# Patient Record
Sex: Male | Born: 1980 | Race: White | Hispanic: No | Marital: Single | Smoking: Current every day smoker
Health system: Southern US, Community
[De-identification: ages and names within clinical notes are randomized; demographics above are authoritative.]

## PROBLEM LIST (undated history)

## (undated) DIAGNOSIS — I10 Essential (primary) hypertension: Secondary | ICD-10-CM

---
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
## 2005-02-10 ENCOUNTER — Emergency Department (HOSPITAL_COMMUNITY): Admission: EM | Admit: 2005-02-10 | Discharge: 2005-02-10 | Payer: Self-pay | Admitting: Emergency Medicine

## 2005-07-17 ENCOUNTER — Emergency Department (HOSPITAL_COMMUNITY): Admission: EM | Admit: 2005-07-17 | Discharge: 2005-07-17 | Payer: Self-pay | Admitting: Emergency Medicine

## 2005-08-21 ENCOUNTER — Ambulatory Visit (HOSPITAL_COMMUNITY): Admission: RE | Admit: 2005-08-21 | Discharge: 2005-08-21 | Payer: Self-pay | Admitting: Emergency Medicine

## 2005-08-21 ENCOUNTER — Emergency Department (HOSPITAL_COMMUNITY): Admission: EM | Admit: 2005-08-21 | Discharge: 2005-08-21 | Payer: Self-pay | Admitting: Emergency Medicine

## 2005-10-08 ENCOUNTER — Inpatient Hospital Stay (HOSPITAL_COMMUNITY): Admission: EM | Admit: 2005-10-08 | Discharge: 2005-10-13 | Payer: Self-pay | Admitting: *Deleted

## 2005-10-13 ENCOUNTER — Ambulatory Visit: Payer: Self-pay | Admitting: Gastroenterology

## 2005-10-19 ENCOUNTER — Ambulatory Visit: Payer: Self-pay | Admitting: Nurse Practitioner

## 2005-12-25 ENCOUNTER — Emergency Department (HOSPITAL_COMMUNITY): Admission: EM | Admit: 2005-12-25 | Discharge: 2005-12-25 | Payer: Self-pay | Admitting: *Deleted

## 2008-07-07 ENCOUNTER — Emergency Department (HOSPITAL_COMMUNITY): Admission: EM | Admit: 2008-07-07 | Discharge: 2008-07-07 | Payer: Self-pay | Admitting: Emergency Medicine

## 2009-10-08 ENCOUNTER — Emergency Department: Payer: Self-pay | Admitting: Emergency Medicine

## 2010-07-09 LAB — DIFFERENTIAL
Eosinophils Absolute: 0.3 10*3/uL (ref 0.0–0.7)
Lymphocytes Relative: 24 % (ref 12–46)
Neutro Abs: 6.5 10*3/uL (ref 1.7–7.7)

## 2010-07-09 LAB — COMPREHENSIVE METABOLIC PANEL
ALT: 40 U/L (ref 0–53)
AST: 34 U/L (ref 0–37)
BUN: 4 mg/dL — ABNORMAL LOW (ref 6–23)
CO2: 25 mEq/L (ref 19–32)
Chloride: 105 mEq/L (ref 96–112)
Creatinine, Ser: 0.94 mg/dL (ref 0.4–1.5)
GFR calc Af Amer: >60 mL/min (ref >60)
Glucose, Bld: 112 mg/dL — ABNORMAL HIGH (ref 70–99)
Potassium: 3.7 mEq/L (ref 3.5–5.1)
Total Bilirubin: 0.6 mg/dL (ref 0.3–1.2)
Total Protein: 7.1 g/dL (ref 6.0–8.3)

## 2010-07-09 LAB — URINALYSIS, ROUTINE W REFLEX MICROSCOPIC
Hgb urine dipstick: NEGATIVE
Ketones, ur: 15 mg/dL — AB
Protein, ur: NEGATIVE mg/dL
Specific Gravity, Urine: 1.018 (ref 1.005–1.030)
Urobilinogen, UA: 0.2 mg/dL (ref 0.0–1.0)
pH: 6 (ref 5.0–8.0)

## 2010-07-09 LAB — CBC
HCT: 45 % (ref 39.0–52.0)
MCV: 98.5 fL (ref 78.0–100.0)
Platelets: 205 10*3/uL (ref 150–400)
RBC: 4.57 MIL/uL (ref 4.22–5.81)

## 2010-07-09 LAB — PROTIME-INR: INR: 1 (ref 0.00–1.49)

## 2010-08-15 NOTE — Discharge Summary (Signed)
[name], [name]              ACCOUNT NO.:  [id number]   MEDICAL RECORD NO.:  [id number]          PATIENT TYPE:  INP   LOCATION:  5704                         FACILITY:  [facility]   PHYSICIAN:  Kela Millin, M.D.DATE OF BIRTH:  1981-01-03   DATE OF ADMISSION:  10/08/2005  DATE OF DISCHARGE:                                 DISCHARGE SUMMARY   DISCHARGE DIAGNOSES:  1.  Tylenol toxicity.  2.  Acute liver failure - secondary to #1.  3.  Urinary tract infection.  4.  Anorectal bleeding.  5.  Question dental abscess - the patient status post tooth extraction, to      follow up with dentist.  6.  Alcohol/marijuana abuse.  7.  Hypokalemia - resolved.   CONSULTATIONS:  Gastroenterology.   STUDIES:  1.  Abdominal ultrasound:  Contracted gallbladder, suggestion of thickened      wall, likely secondary to poor distention; mild hepatomegaly.  2.  Acute hepatitis panel - negative.   HISTORY:  The patient is a 30 year old male who presented with progressively-  worsening abdominal pain, nausea, vomiting over 2 days.  He admitted to  alcohol abuse - a 6-pack of alcohol daily.  Per admission H&P he reported  that he had been having cramping pain in his thigh and therefore started  taking Tylenol, although while he was in the hospital he stated he was  taking the Tylenol because of jaw/tooth pain and that he had recently had  his tooth extracted.  The patient indicated that he took about 23 tablets of  Tylenol, unclear what strength it was, in less than 12-hour period for pain.  The next day he noted that his urine was dark and he began feeling nauseous,  he was vomiting, also experiencing upper abdominal pain.  He denied  hematemesis.  He also denied chest pain, shortness of breath, dysuria, and  no fevers.  In the ER he had labs done which showed markedly-elevated LFTs  and he was admitted to the [facility] for further evaluation  and management.   His physical exam  upon admission as per Dr. Julio Sicks revealed a blood  pressure of 148/93, his respiratory rate was 20, temperature 98.6, O2  saturation of 98%.  The pertinent findings on exam were on his abdomen,  epigastric tenderness and no hepatosplenomegaly appreciated.  The rest of  the exam was noted to be within normal limits.   LABORATORY DATA:  Urine was brown, cloudy, specific gravity 1.036, pH of 5,  urine nitrite positive, small leukocyte esterase, 7-10 wbc's.  His white  cell count was 13.4, hemoglobin 18, hematocrit 50.6, and platelet count 194.  His salicylate level was 0.8.  Amylase 108.  INR 3.8, PT 37.1.  His sodium  136, potassium 4.1, chloride 98, CO2 25, glucose was 95, BUN 10, creatinine  1.2, calcium 9.4, total protein 7.4, albumin 4.6, AST 7805, ALT 6591,  alkaline phosphatase 243, total bilirubin 6.6.   HOSPITAL COURSE:  #1 - ACUTE LIVER FAILURE/TYLENOL TOXICITY.  The patient  was admitted to the intensive care unit and started on IV Mucomyst.  Gastroenterology was consulted and they agreed with the above management.  His LFTs were monitored and on the following hospital day the patient's LFTs  had worsened with an AST increasing to 9216, ALT up to 8125, alkaline  phosphatase 204, total bilirubin slightly improved to 8.4, the INR worsening  to 4.8.  With these findings, gastroenterology discussed the patient with a  hepatologist at [facility] and the patient was accepted for transfer but no bed was  available at the time.  By the next hospital day the patient's LFTs were  rechecked again and they had begun improving at this time, with ALT of 4646,  AST 2891, alkaline phosphatase 145, total bilirubin 6.8, and INR down to  2.6.  With this improvement, [facility] saw the patient and decided that  with this turn-around the patient did not require transfer to [facility].  An  abdominal ultrasound was done and the results as stated above.  He had a  hepatitis panel done and this was negative.  The  patient was continued on IV  Mucomyst through October 11, 2005, and this was discontinued.  The patient's  LFTs continued to gradually improve and his symptoms resolved as well.  The  patient's last LFTs today show alkaline phosphatase of 135, SGOT of 247,  SGPT 1693, total protein 5.0, albumin 2.7, PT 17.1, INR 1.4, PTT 32.  The  patient is tolerating p.o. well and his abdominal pain has resolved.  The  patient was seen by GI on rounds and recommended that he be discharged and  follow up as an outpatient.  He is to go to [facility] to have his LFTs  closely monitored.  The patient has been counseled to avoid Tylenol, NSAIDs,  and alcohol.   #2 - ANORECTAL BLEEDING.  While in the hospital the patient complained of  bloody stools and a rectal exam was done by gastroenterologist and it was  guaiac negative.  The patient had some mild tenderness in his anorectal  area.  His followup H&H was noted to be stable at 14.4 and 43.3.  The  patient was placed on Colace as well as Anusol cream.  He has not had any  further episodes of rectal bleeding.   #3 - URINARY TRACT INFECTION.  The patient had a UA done upon admission and  the results as stated above.  He was empirically placed on antibiotics and  has received full antibiotic course for UTI.  He has remained afebrile with  no leukocytosis.  He will not be requiring any further antibiotics upon  discharge.   #4 - QUESTION DENTAL ABSCESS.  The patient will be set up by social  work/case manager to follow up at the [facility] dental clinic.  Oxycodone  recommended per GI for pain management.   #5 - ALCOHOL/MARIJUANA ABUSE.  The patient counseled to quit.   #6 - HYPOKALEMIA.  Potassium replaced.  His potassium today prior to  discharge is 3.7.   DISCHARGE MEDICATIONS:  1.  Oxycodone 5 mg one q.6-8h. p.r.n.  2.  Colace 100 mg one b.i.d.  3.  Anusol apply b.i.d.  FOLLOWUP CARE:  The patient to follow up at [facility] for LFTs in the next   5-7 days, appointment as scheduled per case manager.   DISCHARGE CONDITION:  Improved/stable.      Kela Millin, M.D.  Electronically Signed     ACV/MEDQ  D:  10/13/2005  T:  10/13/2005  Job:  [id number]   cc:   Dala Dock

## 2010-08-15 NOTE — H&P (Signed)
NAMECHRISTOPHERJOHN, [name] NO.:  [id number]   MEDICAL RECORD NO.:  [id number]          PATIENT TYPE:  INP   LOCATION:  2113                         FACILITY:  [facility]   PHYSICIAN:  Jackie Plum, M.D.DATE OF BIRTH:  04/08/1980   DATE OF ADMISSION:  10/08/2005  DATE OF DISCHARGE:                                HISTORY & PHYSICAL   CHIEF COMPLAINT:  Nausea and vomiting and upper abdominal pain.   HISTORY OF PRESENT ILLNESS:  The patient is a 30 year old African-American  gentleman who presented with progressively worsening abdominal pain, nausea  and vomiting over the last 2 days.  According to the patient, he drinks  about a 6-pack of alcohol daily.  Three days ago he had pain which was  cramping in his thighs and therefore started to take acetaminophen.  He  indicated that he popped up into his mouth about 23 to 22 tablets in all.  The strength is unclear, and this was done over a period of less than 12  hours, for the pain.  He indicates that the next day his urine started to  turn dark and he started feeling nauseous with vomiting as well as upper  abdominal pain.  He has not had any hematemesis.  He denies any constipation  or diarrhea.  No chest pain, no shortness of breath.  He denies any dysuria  or problems with micturition.  On account of these symptoms the patient came  to the ED, whereupon on lab he was noted to have excessive elevated  transaminases.  The patient was admitted for further evaluation and  treatment.   PAST MEDICAL HISTORY:  The patient has a history of diet-controlled  hypertension.   FAMILY HISTORY:  Positive for hypertension, heart disease.   SOCIAL HISTORY:  The patient smokes cigarettes, drinks alcohol as noted  above, as well as marijuana.   REVIEW OF SYSTEMS:  As noted above, otherwise unremarkable.   The patient apparently told that he is not any regular medications  prescribed or nonprescribed.  He is not taking any  over-the-counter  medicines and is not taking any herbal preparations.   PHYSICAL EXAMINATION:  VITAL SIGNS:  Blood pressure was 148/93, pulse was  132, respirations 20, temperature 98.6 degrees Fahrenheit, O2 saturation 98%  on room air.  GENERAL:  A young African-American gentleman, well-built, lying on a  stretcher in the ED.  He was not in acute cardiopulmonary distress.  HEENT:  Normocephalic, atraumatic.  Pupils were equal, round, and reactive  to light.  Extraocular movements were intact.  The oropharynx was moist.  The patient has mild change of scleral icterus without pallor.  NECK:  Supple, no JVD.Marland Kitchen  LUNGS:  Clear to auscultation.  ABDOMEN:  Soft with epigastric tenderness.  There was no rebound.  I could  not appreciate any hepatosplenomegaly.  EXTREMITIES:  No cyanosis, no edema.  CENTRAL NERVOUS SYSTEM:  Nonfocal.   LABORATORY DATA:  Urine color was brown, cloudy, specific gravity of 1.036,  pH of 5.0, negative glucose, protein.  Urine nitrite was positive.  He had  large bilirubin, small leukocyte esterase,  7-10 wbc's per high-power field.  WBC count on CBC indicated that WBC was 13.4, hemoglobin 18.0, hematocrit  50.6, MCV 100.5, platelet count 194.  PT 37.1, INR 3.8.  Salicylate level is  0.8 mg/dl.  Amylase 108.  Sodium 136, potassium 4.1, chloride 98, CO2 25,  glucose 95, BUN 10, creatinine 1.2, calcium 9.4, total protein 7.4, albumin  4.6, AST 7805, ALT 6591, alkaline phosphatase 243, total bilirubin 9.6.   IMPRESSION:  1.  Acute hepatic injury secondary to acetaminophen toxicity.  2.  Urinary tract infection.  3.  History of alcohol abuse with subsequent macrocytosis.  4.  Mild polycythemia.   PLAN:  The patient is being admitted to ICU.  He is going to be started on  Mucomyst IV per protocol with monitoring of his INR and LFTs.  Other  supportive measures have been started, including analgesics and antiemetics.  Dr. Arlyce Dice of GI medicine has been consulted.   No ultrasound of the liver  was ordered.  This may be done if there is worsening of his hepatic  function.  His ANA should guide whether the Mucomyst is discontinued and  when it is discontinued.  The patient obviously will need counseling for  alcohol abuse on discharge.  Though his acetaminophen level is normal, its  potential to cause toxicity is highly augmented/precipitated by the abuse of  alcohol.      Jackie Plum, M.D.  Electronically Signed     GO/MEDQ  D:  10/09/2005  T:  10/09/2005  Job:  [id number]   cc:   Teena Irani. Arlyce Dice, M.D.

## 2010-10-14 ENCOUNTER — Emergency Department (HOSPITAL_COMMUNITY)
Admission: EM | Admit: 2010-10-14 | Discharge: 2010-10-15 | Disposition: A | Discharge status: Home / Self Care | Payer: Self-pay | Attending: Emergency Medicine | Admitting: Emergency Medicine

## 2010-10-14 DIAGNOSIS — F1010 Alcohol abuse, uncomplicated: Secondary | ICD-10-CM | POA: Insufficient documentation

## 2010-10-14 DIAGNOSIS — R000 Tachycardia, unspecified: Secondary | ICD-10-CM | POA: Insufficient documentation

## 2010-10-15 LAB — CBC
Hemoglobin: 16.3 g/dL (ref 13.0–17.0)
MCH: 34.4 pg — ABNORMAL HIGH (ref 26.0–34.0)
MCV: 98.7 fL (ref 78.0–100.0)
Platelets: 201 10*3/uL (ref 150–400)
RBC: 4.74 MIL/uL (ref 4.22–5.81)

## 2010-10-15 LAB — DIFFERENTIAL
Basophils Absolute: 0 10*3/uL (ref 0.0–0.1)
Basophils Relative: 0 % (ref 0–1)
Eosinophils Relative: 2 % (ref 0–5)
Lymphs Abs: 3 10*3/uL (ref 0.7–4.0)
Monocytes Absolute: 0.9 10*3/uL (ref 0.1–1.0)

## 2010-10-15 LAB — BASIC METABOLIC PANEL
GFR calc non Af Amer: >60 mL/min (ref >60)
Potassium: 3.4 mEq/L — ABNORMAL LOW (ref 3.5–5.1)
Sodium: 134 mEq/L — ABNORMAL LOW (ref 135–145)

## 2010-10-15 LAB — RAPID URINE DRUG SCREEN, HOSP PERFORMED
Amphetamines: NOT DETECTED
Benzodiazepines: NOT DETECTED
Cocaine: NOT DETECTED
Tetrahydrocannabinol: POSITIVE — AB

## 2013-10-16 ENCOUNTER — Emergency Department: Payer: Self-pay | Admitting: Emergency Medicine

## 2013-10-16 LAB — COMPREHENSIVE METABOLIC PANEL
ALK PHOS: 83 U/L
Albumin: 4 g/dL (ref 3.4–5.0)
Anion Gap: 8 (ref 7–16)
BILIRUBIN TOTAL: 0.2 mg/dL (ref 0.2–1.0)
BUN: 8 mg/dL (ref 7–18)
CALCIUM: 8.4 mg/dL — AB (ref 8.5–10.1)
CO2: 25 mmol/L (ref 21–32)
CREATININE: 0.9 mg/dL (ref 0.60–1.30)
Chloride: 106 mmol/L (ref 98–107)
EGFR (African American): >60
EGFR (Non-African Amer.): >60
GLUCOSE: 110 mg/dL — AB (ref 65–99)
Osmolality: 277 (ref 275–301)
POTASSIUM: 4.1 mmol/L (ref 3.5–5.1)
SGOT(AST): 27 U/L (ref 15–37)
SGPT (ALT): 28 U/L (ref 12–78)
Sodium: 139 mmol/L (ref 136–145)
Total Protein: 8 g/dL (ref 6.4–8.2)

## 2013-10-16 LAB — CBC
HCT: 46.8 % (ref 40.0–52.0)
HGB: 15.5 g/dL (ref 13.0–18.0)
MCH: 32.9 pg (ref 26.0–34.0)
MCHC: 33.1 g/dL (ref 32.0–36.0)
MCV: 99 fL (ref 80–100)
PLATELETS: 204 10*3/uL (ref 150–440)
RBC: 4.72 10*6/uL (ref 4.40–5.90)
RDW: 12.4 % (ref 11.5–14.5)
WBC: 7.5 10*3/uL (ref 3.8–10.6)

## 2013-10-16 LAB — CK TOTAL AND CKMB (NOT AT ARMC)
CK, TOTAL: 166 U/L
CK-MB: 0.8 ng/mL (ref 0.5–3.6)

## 2013-10-16 LAB — TROPONIN I

## 2013-10-16 LAB — PRO B NATRIURETIC PEPTIDE: B-Type Natriuretic Peptide: 26 pg/mL (ref 0–125)

## 2015-10-09 ENCOUNTER — Emergency Department (HOSPITAL_COMMUNITY)
Admission: EM | Admit: 2015-10-09 | Discharge: 2015-10-09 | Disposition: A | Discharge status: Home / Self Care | Payer: Self-pay | Attending: Emergency Medicine | Admitting: Emergency Medicine

## 2015-10-09 ENCOUNTER — Encounter (HOSPITAL_COMMUNITY): Payer: Self-pay

## 2015-10-09 DIAGNOSIS — F17210 Nicotine dependence, cigarettes, uncomplicated: Secondary | ICD-10-CM | POA: Insufficient documentation

## 2015-10-09 DIAGNOSIS — K0889 Other specified disorders of teeth and supporting structures: Secondary | ICD-10-CM | POA: Insufficient documentation

## 2015-10-09 DIAGNOSIS — I10 Essential (primary) hypertension: Secondary | ICD-10-CM | POA: Insufficient documentation

## 2015-10-09 HISTORY — DX: Essential (primary) hypertension: I10

## 2015-10-09 MED ORDER — CHLORHEXIDINE GLUCONATE 0.12 % MT SOLN: 15.0000 mL | Freq: Two times a day (BID) | OROMUCOSAL | Status: AC

## 2015-10-09 MED ORDER — PENICILLIN V POTASSIUM 500 MG PO TABS: 500.0000 mg | ORAL_TABLET | Freq: Four times a day (QID) | ORAL | Status: AC

## 2015-10-09 NOTE — Discharge Instructions (Signed)

## 2015-10-09 NOTE — ED Provider Notes (Signed)
CSN: [id number]     Arrival date & time 10/09/15  1051 History   By signing my name below, I, [facility], attest that this documentation has been prepared under the direction and in the presence of Roxy Horseman, PA-C. Electronically Signed: Randell Patient, ED Scribe. 10/09/2015. 11:21 AM.    Chief Complaint  Patient presents with  . Dental Problem    The history is provided by the patient. No language interpreter was used.   HPI Comments: [name] is a 35 y.o. male who presents to the Emergency Department complaining of constant, moderate, gradually improving, right lower dental pain onset 4 days ago. Pt states that he noticed an abscess inside his mouth on the right lower side followed by pain to the affected area which gradually worsened over a three-day period before improving this morning. He reports an abscess to the roof of his mouth last night. Denies fever.  Past Medical History  Diagnosis Date  . Hypertension    History reviewed. No pertinent past surgical history. No family history on file. Social History  Substance Use Topics  . Smoking status: Current Every Day Smoker    Types: Cigarettes  . Smokeless tobacco: None  . Alcohol Use: None    Review of Systems  Constitutional: Negative for fever.  HENT: Positive for dental problem.       Allergies  Review of patient's allergies indicates no known allergies.  Home Medications   Prior to Admission medications   Medication Sig Start Date End Date Taking? Authorizing Provider  chlorhexidine (PERIDEX) 0.12 % solution Use as directed 15 mLs in the mouth or throat 2 (two) times daily. 10/09/15   Roxy Horseman, PA-C  penicillin v potassium (VEETID) 500 MG tablet Take 1 tablet (500 mg total) by mouth 4 (four) times daily. 10/09/15   Roxy Horseman, PA-C   BP 168/130 mmHg  Pulse 112  Temp(Src) 98.3 F (36.8 C) (Oral)  Resp 18  SpO2 99% Physical Exam  Physical Exam  Constitutional: Pt appears  well-developed and well-nourished.  HENT:  Head: Normocephalic.  Right Ear: Tympanic membrane, external ear and ear canal normal.  Left Ear: Tympanic membrane, external ear and ear canal normal.  Nose: Nose normal. Right sinus exhibits no maxillary sinus tenderness and no frontal sinus tenderness. Left sinus exhibits no maxillary sinus tenderness and no frontal sinus tenderness.  Mouth/Throat: Uvula is midline, oropharynx is clear and moist and mucous membranes are normal. No oral lesions. No uvula swelling or lacerations. No oropharyngeal exudate, posterior oropharyngeal edema, posterior oropharyngeal erythema or tonsillar abscesses.  Poor dentition No gingival swelling, fluctuance or induration No gross abscess  No sublingual edema, tenderness to palpation, or sign of Ludwig's angina, or deep space infection Pain at right lower rear molars Also has firm small mass on roof of mouth, no fluctuance, doubt abscess Eyes: Conjunctivae are normal. Pupils are equal, round, and reactive to light. Right eye exhibits no discharge. Left eye exhibits no discharge.  Neck: Normal range of motion. Neck supple.  No stridor Handling secretions without difficulty No nuchal rigidity No cervical lymphadenopathy Cardiovascular: Normal rate, regular rhythm and normal heart sounds.   Pulmonary/Chest: Effort normal. No respiratory distress.  Equal chest rise  Abdominal: Soft. Bowel sounds are normal. Pt exhibits no distension. There is no tenderness.  Lymphadenopathy: Pt has no cervical adenopathy.  Neurological: Pt is alert and oriented x 4  Skin: Skin is warm and dry.  Psychiatric: Pt has a normal mood and affect.  Nursing note and vitals reviewed.    ED Course  Procedures   DIAGNOSTIC STUDIES: Oxygen Saturation is 99% on RA, normal by my interpretation.    COORDINATION OF CARE: 11:04 AM Will consult with attending MD Dr. Gerhard Munchobert Lockwood. Discussed treatment plan with pt at bedside and pt agreed to  plan.  11:14 AM Consulted with Dr. Jeraldine LootsLockwood who examined the pt. Will prescribe penicillin and chlorhexidine. Advised pt to follow-up with a dentist. Will discharge pt.   MDM   Final diagnoses:  Pain, dental    Patient with dentalgia.  No abscess requiring immediate incision and drainage.  Exam not concerning for Ludwig's angina or pharyngeal abscess.  Will treat with penicillin. Pt instructed to follow-up with dentist.  Discussed return precautions. Pt safe for discharge.   I personally performed the services described in this documentation, which was scribed in my presence. The recorded information has been reviewed and is accurate.      Roxy HorsemanRobert Khamauri Bauernfeind, PA-C 10/09/15 1136  Gerhard Munchobert Lockwood, MD 10/10/15 [phone]

## 2015-10-09 NOTE — ED Notes (Signed)
Declined W/C at D/C and was escorted to lobby by RN. 

## 2015-10-09 NOTE — ED Notes (Addendum)
Patient here with right lower broken tooth with pain x 2 days, also concerned that he has raised area to roof of mouth with pain since last pm. curently is not taking BP meds.

## 2021-07-21 ENCOUNTER — Emergency Department (HOSPITAL_BASED_OUTPATIENT_CLINIC_OR_DEPARTMENT_OTHER)
Admission: EM | Admit: 2021-07-21 | Discharge: 2021-07-21 | Disposition: A | Discharge status: Home / Self Care | Payer: Worker's Compensation | Attending: Emergency Medicine | Admitting: Emergency Medicine

## 2021-07-21 ENCOUNTER — Emergency Department (HOSPITAL_BASED_OUTPATIENT_CLINIC_OR_DEPARTMENT_OTHER): Payer: Worker's Compensation

## 2021-07-21 ENCOUNTER — Encounter (HOSPITAL_BASED_OUTPATIENT_CLINIC_OR_DEPARTMENT_OTHER): Payer: Self-pay | Admitting: Emergency Medicine

## 2021-07-21 DIAGNOSIS — S51012A Laceration without foreign body of left elbow, initial encounter: Secondary | ICD-10-CM | POA: Diagnosis not present

## 2021-07-21 DIAGNOSIS — S59902A Unspecified injury of left elbow, initial encounter: Secondary | ICD-10-CM | POA: Diagnosis present

## 2021-07-21 DIAGNOSIS — I10 Essential (primary) hypertension: Secondary | ICD-10-CM | POA: Insufficient documentation

## 2021-07-21 DIAGNOSIS — Y990 Civilian activity done for income or pay: Secondary | ICD-10-CM | POA: Diagnosis not present

## 2021-07-21 DIAGNOSIS — Z23 Encounter for immunization: Secondary | ICD-10-CM | POA: Insufficient documentation

## 2021-07-21 DIAGNOSIS — W208XXA Other cause of strike by thrown, projected or falling object, initial encounter: Secondary | ICD-10-CM | POA: Diagnosis not present

## 2021-07-21 MED ORDER — LIDOCAINE HCL (PF) 1 % IJ SOLN
5.0000 mL | Freq: Once | INTRAMUSCULAR | Status: AC
  Administered 2021-07-21: 5 mL
  Filled 2021-07-21: qty 5

## 2021-07-21 MED ORDER — TETANUS-DIPHTH-ACELL PERTUSSIS 5-2.5-18.5 LF-MCG/0.5 IM SUSY
0.5000 mL | PREFILLED_SYRINGE | Freq: Once | INTRAMUSCULAR | Status: AC
  Administered 2021-07-21: 0.5 mL via INTRAMUSCULAR
  Filled 2021-07-21: qty 0.5

## 2021-07-21 MED ORDER — ACETAMINOPHEN 500 MG PO TABS
1000.0000 mg | ORAL_TABLET | Freq: Once | ORAL | Status: AC
  Administered 2021-07-21: 1000 mg via ORAL
  Filled 2021-07-21: qty 2

## 2021-07-21 NOTE — ED Triage Notes (Signed)
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 Bleeding controlled at this time with pressure bandage.  Denies blood thinners.  ?

## 2021-07-21 NOTE — ED Provider Notes (Signed)
?[facility] EMERGENCY DEPARTMENT ?Provider Note ? ? ?CSN: [id number] ?Arrival date & time: 07/21/21  1257 ? ?  ? ?History ? ?Chief Complaint  ?Patient presents with  ? Laceration  ? ? ?[name] is a 41 y.o. male. ? ?41 year old male with a past medical history of HTN compliance with medication presents to the ED with a chief complaint of left elbow laceration while at work 3 hours ago.  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  Does report applying a pressure dressing to the area, taking some Aleve without much improvement in symptoms.  Patient endorses pain along the area with palpation along with extension.  Currently on no blood thinners, denying any other injury. ?Last tetanus immunization is unknown. ? ?The history is provided by the patient.  ?Laceration ?Location:  Shoulder/arm ?Shoulder/arm laceration location:  L elbow ?Length:  2 ?Depth:  Cutaneous ?Quality: straight   ?Bleeding: venous and controlled   ?Time since incident:  3 hours ?Laceration mechanism:  Blunt object ?Pain details:  ?  Quality:  Aching ?Tetanus status:  Out of date ?Associated symptoms: no fever   ? ?  ? ?Home Medications ?Prior to Admission medications   ?Medication Sig Start Date End Date Taking? Authorizing Provider  ?chlorhexidine (PERIDEX) 0.12 % solution Use as directed 15 mLs in the mouth or throat 2 (two) times daily. 10/09/15   Montine Circle, PA-C  ?penicillin v potassium (VEETID) 500 MG tablet Take 1 tablet (500 mg total) by mouth 4 (four) times daily. 10/09/15   Montine Circle, PA-C  ?   ? ?Allergies    ?Penicillins   ? ?Review of Systems   ?Review of Systems  ?Constitutional:  Negative for chills and fever.  ?Respiratory:  Negative for shortness of breath.   ?Cardiovascular:  Negative for chest pain.  ?Gastrointestinal:  Negative for abdominal pain, nausea and vomiting.  ?Skin:  Positive for wound.  ?Neurological:  Negative for weakness and  headaches.  ?All other systems reviewed and are negative. ? ?Physical Exam ?Updated Vital Signs ?BP (!) 180/110   Pulse 83   Temp 98.2 ?F (36.8 ?C) (Oral)   Resp 17   Ht 6\' 3"  (1.905 m)   Wt 99.8 kg   SpO2 98%   BMI 27.50 kg/m?  ?Physical Exam ?Vitals and nursing note reviewed.  ?Constitutional:   ?   Appearance: Normal appearance.  ?HENT:  ?   Head: Normocephalic and atraumatic.  ?   Nose: Nose normal.  ?   Mouth/Throat:  ?   Mouth: Mucous membranes are moist.  ?Cardiovascular:  ?   Rate and Rhythm: Normal rate.  ?Pulmonary:  ?   Effort: Pulmonary effort is normal.  ?Abdominal:  ?   General: Abdomen is flat.  ?Musculoskeletal:  ?   Left elbow: Effusion and laceration present. Normal range of motion. Tenderness present in lateral epicondyle.  ?   Cervical back: Normal range of motion and neck supple.  ?   Comments: Pain with left elbow flexion.  Pulses present, strength is equal bilaterally. ?  ?Skin: ?   General: Skin is warm and dry.  ?   Findings: Erythema and laceration present.  ?   Comments: 2 cm laceration to the left elbow, bleeding is controlled at this time with some hemostasis.    ?Neurological:  ?   Mental Status: He is alert.  ? ? ?ED Results / Procedures / Treatments   ?  Labs ?(all labs ordered are listed, but only abnormal results are displayed) ?Labs Reviewed - No data to display ? ?EKG ?None ? ?Radiology ?DG Elbow Complete Left ? ?Result Date: 07/21/2021 ?CLINICAL DATA:  Left elbow pain after injury today. EXAM: LEFT ELBOW - COMPLETE 3+ VIEW COMPARISON:  None. FINDINGS: There is no evidence of fracture, dislocation, or joint effusion. There is no evidence of arthropathy or other focal bone abnormality. Soft tissues are unremarkable. IMPRESSION: Negative. Electronically Signed   By: Marijo Conception M.D.   On: 07/21/2021 13:44   ? ?Procedures ?Marland Kitchen.Laceration Repair ? ?Date/Time: 07/21/2021 4:09 PM ?Performed by: Janeece Fitting, PA-C ?Authorized by: Janeece Fitting, PA-C  ? ?Consent:  ?  Consent obtained:   Verbal ?  Consent given by:  Patient ?  Risks discussed:  Infection, poor cosmetic result, pain and need for additional repair ?Universal protocol:  ?  Patient identity confirmed:  Verbally with patient ?Anesthesia:  ?  Anesthesia method:  Local infiltration ?  Local anesthetic:  Lidocaine 1% w/o epi ?Laceration details:  ?  Location:  Shoulder/arm ?  Shoulder/arm location:  L elbow ?  Length (cm):  2 ?  Depth (mm):  1 ?Exploration:  ?  Hemostasis achieved with:  Direct pressure ?Treatment:  ?  Area cleansed with:  Saline ?  Amount of cleaning:  Extensive ?  Irrigation solution:  Sterile saline ?Skin repair:  ?  Repair method:  Sutures ?  Suture size:  4-0 ?  Suture material:  Prolene ?  Suture technique:  Simple interrupted ?  Number of sutures:  2 ?Approximation:  ?  Approximation:  Close ?Repair type:  ?  Repair type:  Simple ?Post-procedure details:  ?  Dressing:  Open (no dressing) ?  Procedure completion:  Tolerated well, no immediate complications  ? ? ?Medications Ordered in ED ?Medications  ?Tdap (BOOSTRIX) injection 0.5 mL (0.5 mLs Intramuscular Given 07/21/21 1559)  ?acetaminophen (TYLENOL) tablet 1,000 mg (1,000 mg Oral Given 07/21/21 1559)  ?lidocaine (PF) (XYLOCAINE) 1 % injection 5 mL (5 mLs Infiltration Given 07/21/21 1600)  ? ? ?ED Course/ Medical Decision Making/ A&P ?  ?                        ?Medical Decision Making ?Amount and/or Complexity of Data Reviewed ?Radiology: ordered. ? ?Risk ?OTC drugs. ?Prescription drug management. ? ? ? ?Patient presents to the ED status post left elbow injury while at work 3 hours ago.  Tetanus immunization is out of the date. ? ?On arrival patient was pretty hypertensive with a systolic in the [id number] diastolic in the 0000000, underlying history of high blood pressure however does not take any medication for control.  There is pain with left elbow extension, small 2 cm laceration noted to the lateral aspect with bleeding controlled at this time.  Tenderness to palpation  along the area, along with some tissue swelling noted to the area. ? ?3 obtain while in triage shows no acute fracture or acute pathology noted.  We discussed suture repair at this time with stiches at this time.  Patient did inquire about Dermabond, however I did discuss with him that due to this being an area of tension I do recommend sutures. ? ?I personally repair patient's wound with 2 stitches to the left elbow, he tolerated the procedure well.  Tetanus immunization was administered.  Did discuss with him his elevated blood pressure, patient reports he was diagnosed with hypertension when he  was younger, does not really want to go on to medication at this time.  He is without any chest pain, headache, shortness of breath. ?  ? ?Portions of this note were generated with Lobbyist. Dictation errors may occur despite best attempts at proofreading.   ?Final Clinical Impression(s) / ED Diagnoses ?Final diagnoses:  ?Laceration of left elbow, initial encounter  ? ? ?Rx / DC Orders ?ED Discharge Orders   ? ? None  ? ?  ? ? ?  ?Janeece Fitting, PA-C ?07/21/21 1613 ? ?  ?Deno Etienne, DO ?07/21/21 1807 ? ?

## 2021-07-21 NOTE — Discharge Instructions (Addendum)
I have placed 2 stitches to your left elbow, you will need to have these removed in the next 5 to 7 days. ? ?You may apply Neosporin to the area, or ICE.  ? ?You may have stiches removed at Urgent care, primary care or the emergency department.  ?

## 2023-09-23 IMAGING — DX DG ELBOW COMPLETE 3+V*L*
4 series · 4 of 4 positions shown · non-contrast
Comparison: None.

CLINICAL DATA: Left elbow pain after injury today.

EXAM:
LEFT ELBOW - COMPLETE 3+ VIEW

[elbow ap]
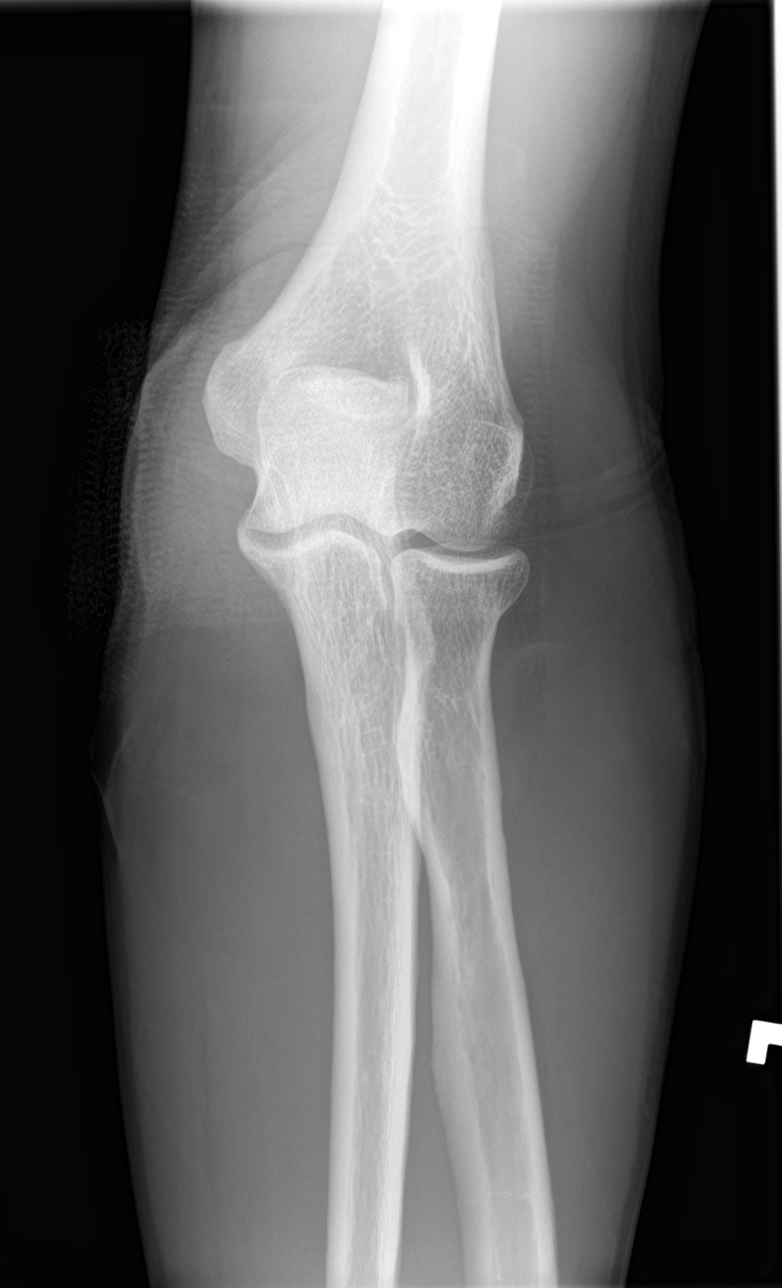

[elbow obl (1 of 2)]
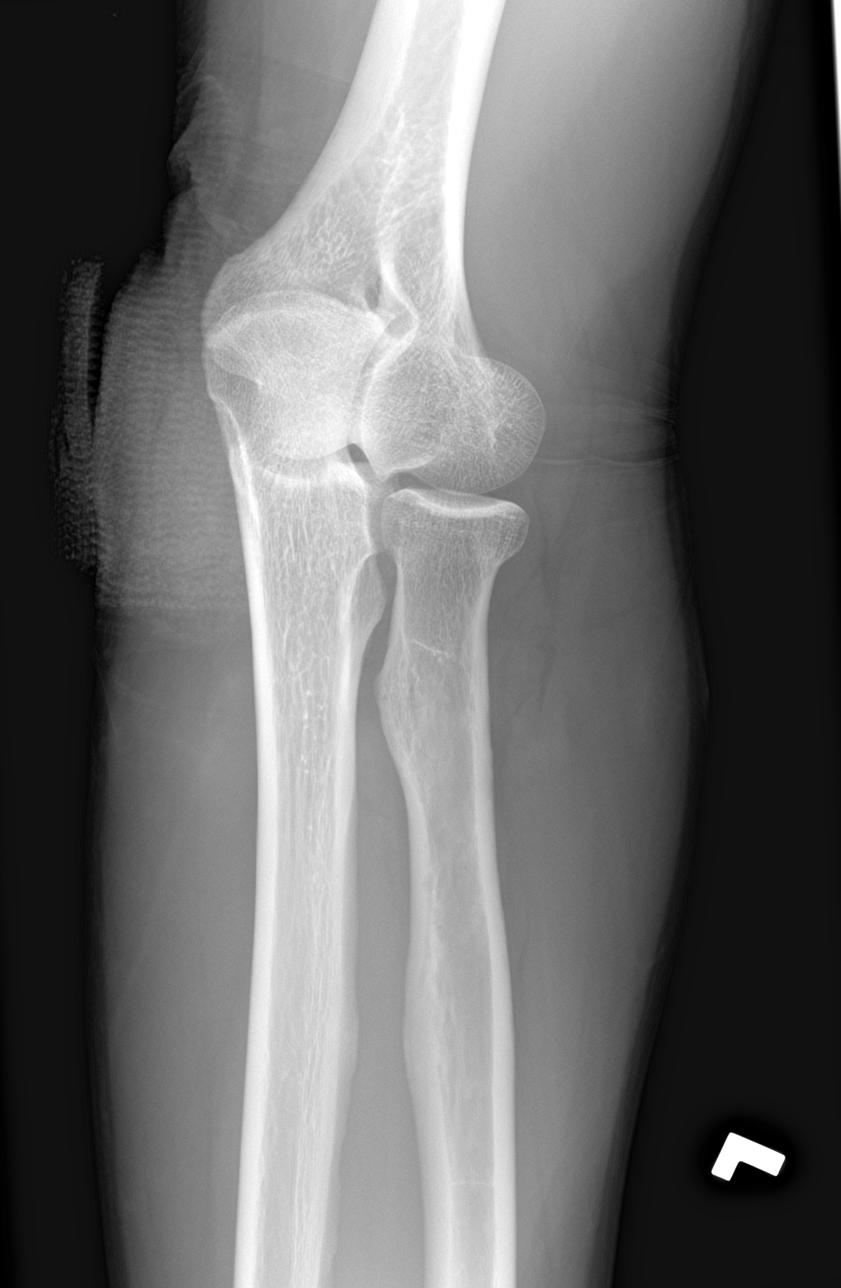

[elbow obl (2 of 2)]
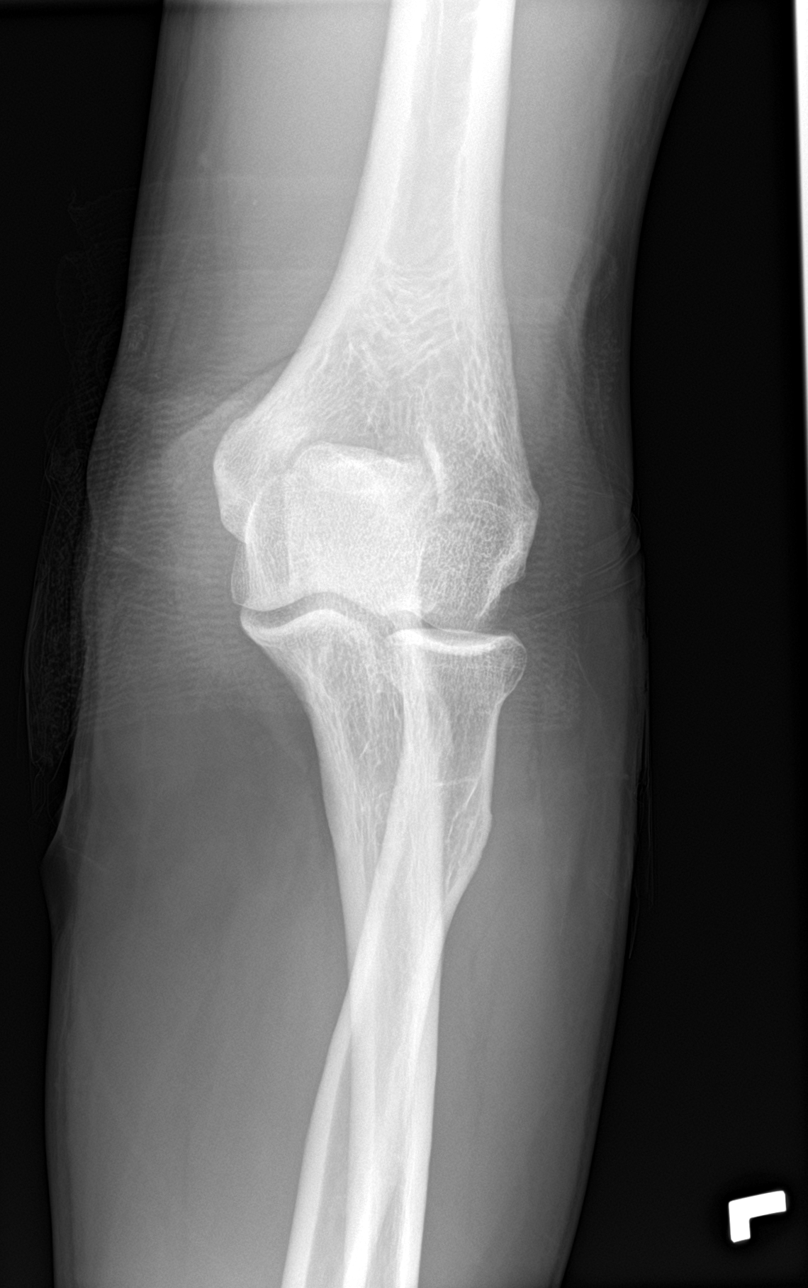

[elbow lat]
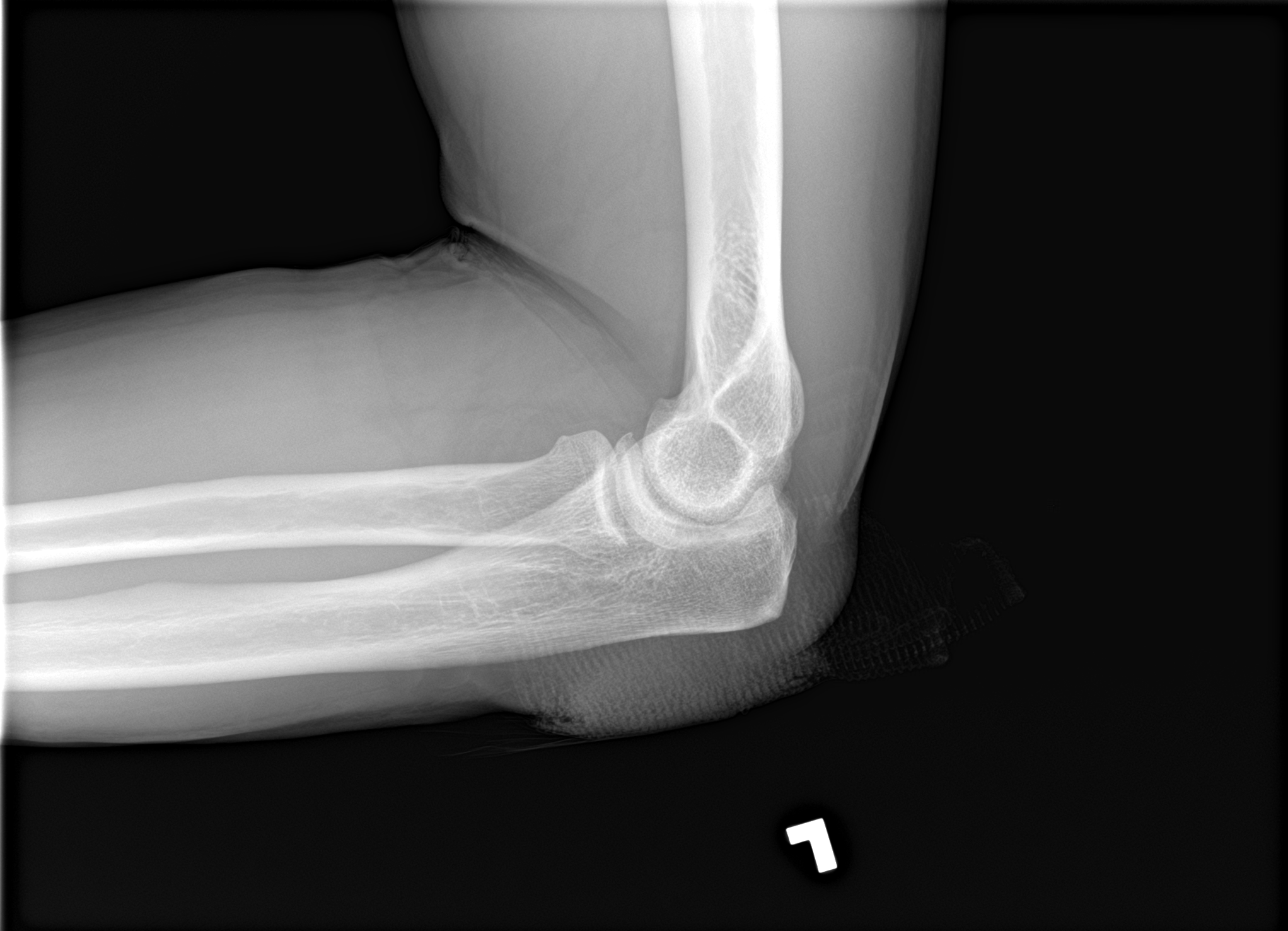

[4 of 4 positions shown; findings below may reference images not displayed]

FINDINGS: There is no evidence of fracture, dislocation, or joint effusion.
There is no evidence of arthropathy or other focal bone abnormality.
Soft tissues are unremarkable.
IMPRESSION: Negative.
